# Patient Record
Sex: Male | Born: 1975 | Race: Black or African American | Hispanic: No | Marital: Single | Smoking: Current every day smoker
Health system: Southern US, Community
[De-identification: ages and names within clinical notes are randomized; demographics above are authoritative.]

## PROBLEM LIST (undated history)

## (undated) HISTORY — PX: HERNIA REPAIR: SHX51

---

## 2006-09-24 ENCOUNTER — Emergency Department: Payer: Self-pay | Admitting: Emergency Medicine
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
## 2016-02-03 ENCOUNTER — Encounter: Payer: Self-pay | Admitting: *Deleted

## 2016-02-03 ENCOUNTER — Ambulatory Visit
Admission: EM | Admit: 2016-02-03 | Discharge: 2016-02-03 | Disposition: A | Discharge status: Home / Self Care | Payer: 59 | Attending: Family Medicine | Admitting: Family Medicine

## 2016-02-03 DIAGNOSIS — Z113 Encounter for screening for infections with a predominantly sexual mode of transmission: Secondary | ICD-10-CM | POA: Diagnosis not present

## 2016-02-03 LAB — CHLAMYDIA/NGC RT PCR (ARMC ONLY)
Chlamydia Tr: NOT DETECTED
N gonorrhoeae: NOT DETECTED

## 2016-02-03 NOTE — ED Triage Notes (Signed)
Pt here for STD eval. States present partner advised him to be checked as she is being treated for an STD.

## 2016-02-03 NOTE — Discharge Instructions (Signed)
Follow up if symptoms develop.

## 2016-02-04 LAB — RPR: RPR Ser Ql: NONREACTIVE

## 2016-02-04 LAB — HIV ANTIBODY (ROUTINE TESTING W REFLEX): HIV SCREEN 4TH GENERATION: NONREACTIVE

## 2016-02-10 ENCOUNTER — Telehealth: Payer: Self-pay | Admitting: *Deleted

## 2016-02-10 NOTE — Telephone Encounter (Signed)
Patient called requesting lab results. Informed patient that his gonorrhea, chlamydia, HIV, and RPR all came back negative. Patient confirmed understanding of information.

## 2016-03-16 NOTE — ED Provider Notes (Signed)
[facility]    CSN: [id number] Arrival date & time: 02/03/16  1321     History   Chief Complaint Chief Complaint  Patient presents with  . Exposure to STD    HPI Jordan Campbell is a 40 y.o. male.   40 yo male here for STD eval. States present partner advised him to be checked as she is being treated for an STD. Denies penile discharge, dysuria, rash.         The history is provided by the patient.  Exposure to STD     History reviewed. No pertinent past medical history.  There are no active problems to display for this patient.   Past Surgical History:  Procedure Laterality Date  . HERNIA REPAIR         Home Medications    Prior to Admission medications   Not on File    Family History History reviewed. No pertinent family history.  Social History Social History  Substance Use Topics  . Smoking status: Current Every Day Smoker  . Smokeless tobacco: Never Used  . Alcohol use Yes     Allergies   Review of patient's allergies indicates no known allergies.   Review of Systems Review of Systems   Physical Exam Triage Vital Signs ED Triage Vitals  Enc Vitals Group     BP 02/03/16 1400 133/89     Pulse Rate 02/03/16 1400 62     Resp 02/03/16 1400 16     Temp 02/03/16 1400 98 F (36.7 C)     Temp Source 02/03/16 1400 Oral     SpO2 02/03/16 1400 98 %     Weight 02/03/16 1402 233 lb (105.7 kg)     Height 02/03/16 1402 5\' 8"  (1.727 m)     Head Circumference --      Peak Flow --      Pain Score 02/03/16 1519 0     Pain Loc --      Pain Edu? --      Excl. in GC? --    No data found.   Updated Vital Signs BP 133/89 (BP Location: Right Arm)   Pulse 62   Temp 98 F (36.7 C) (Oral)   Resp 16   Ht 5\' 8"  (1.727 m)   Wt 233 lb (105.7 kg)   SpO2 98%   BMI 35.43 kg/m   Visual Acuity Right Eye Distance:   Left Eye Distance:   Bilateral Distance:    Right Eye Near:   Left Eye Near:    Bilateral Near:     Physical  Exam   UC Treatments / Results  Labs (all labs ordered are listed, but only abnormal results are displayed) Labs Reviewed  CHLAMYDIA/NGC RT PCR ([facility] ONLY)  RPR  HIV ANTIBODY (ROUTINE TESTING)    EKG  EKG Interpretation None       Radiology No results found.  Procedures Procedures (including critical care time)  Medications Ordered in UC Medications - No data to display   Initial Impression / Assessment and Plan / UC Course  I have reviewed the triage vital signs and the nursing notes.  Pertinent labs & imaging results that were available during my care of the patient were reviewed by me and considered in my medical decision making (see chart for details).  Clinical Course      Final Clinical Impressions(s) / UC Diagnoses   Final diagnoses:  Screen for STD (sexually transmitted disease)  New Prescriptions There are no discharge medications for this patient.  1. diagnosis reviewed with patient 2. Check tests as per orders; further management pending results 3. Follow-up prn if symptoms worsen or don't improve   Payton Mccallumrlando Rejina Odle, MD 03/16/16 1755

## 2018-01-02 ENCOUNTER — Encounter (INDEPENDENT_AMBULATORY_CARE_PROVIDER_SITE_OTHER): Payer: Self-pay | Admitting: Podiatry

## 2018-01-02 NOTE — Progress Notes (Signed)
This encounter was created in error - please disregard.

## 2019-08-24 ENCOUNTER — Ambulatory Visit: Payer: Self-pay | Attending: Internal Medicine

## 2019-08-24 DIAGNOSIS — Z23 Encounter for immunization: Secondary | ICD-10-CM

## 2019-08-25 NOTE — Progress Notes (Signed)
   Covid-19 Vaccination Clinic  Name:  Jordan Campbell    MRN: [id number] DOB: 1975/12/24  08/25/2019  [name] was observed post Covid-19 immunization for 15 minutes without incident. He was provided with Vaccine Information Sheet and instruction to access the V-Safe system.   [name] was instructed to call 911 with any severe reactions post vaccine: Marland Kitchen Difficulty breathing  . Swelling of face and throat  . A fast heartbeat  . A bad rash all over body  . Dizziness and weakness   Immunizations Administered    Name Date Dose VIS Date Route   Moderna COVID-19 Vaccine 08/24/2019  5:03 PM 0.5 mL 05/01/2019 Intramuscular   Manufacturer: Moderna   Lot: [id number]   NDC: 17793-903-00

## 2019-09-21 ENCOUNTER — Ambulatory Visit: Payer: Self-pay | Attending: Internal Medicine

## 2019-09-21 DIAGNOSIS — Z23 Encounter for immunization: Secondary | ICD-10-CM

## 2019-09-21 NOTE — Progress Notes (Signed)
   Covid-19 Vaccination Clinic  Name:  Jordan Campbell    MRN: [id number] DOB: Jun 14, 1975  09/21/2019  Mr. Jordan Campbell was observed post Covid-19 immunization for 15 minutes without incident. He was provided with Vaccine Information Sheet and instruction to access the V-Safe system.   Mr. Jordan Campbell was instructed to call 911 with any severe reactions post vaccine: Marland Kitchen Difficulty breathing  . Swelling of face and throat  . A fast heartbeat  . A bad rash all over body  . Dizziness and weakness   Immunizations Administered    Name Date Dose VIS Date Route   Moderna COVID-19 Vaccine 09/21/2019  3:36 PM 0.5 mL 05/2019 Intramuscular   Manufacturer: Moderna   Lot: [id number]   NDC: 58251-898-42
